# Patient Record
Sex: Male | Born: 2000 | Race: Black or African American | Hispanic: No | Marital: Single | Smoking: Current every day smoker
Health system: Southern US, Community
[De-identification: ages and names within clinical notes are randomized; demographics above are authoritative.]

---
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
## 2019-07-03 DIAGNOSIS — J069 Acute upper respiratory infection, unspecified: Secondary | ICD-10-CM | POA: Diagnosis not present

## 2019-07-03 DIAGNOSIS — R05 Cough: Secondary | ICD-10-CM | POA: Diagnosis not present

## 2019-07-03 DIAGNOSIS — R519 Headache, unspecified: Secondary | ICD-10-CM | POA: Diagnosis not present

## 2019-07-03 DIAGNOSIS — J029 Acute pharyngitis, unspecified: Secondary | ICD-10-CM | POA: Diagnosis not present

## 2019-09-03 DIAGNOSIS — J029 Acute pharyngitis, unspecified: Secondary | ICD-10-CM | POA: Diagnosis not present

## 2019-09-03 DIAGNOSIS — R110 Nausea: Secondary | ICD-10-CM | POA: Diagnosis not present

## 2019-09-03 DIAGNOSIS — B349 Viral infection, unspecified: Secondary | ICD-10-CM | POA: Diagnosis not present

## 2019-09-03 DIAGNOSIS — R519 Headache, unspecified: Secondary | ICD-10-CM | POA: Diagnosis not present

## 2019-09-03 DIAGNOSIS — R109 Unspecified abdominal pain: Secondary | ICD-10-CM | POA: Diagnosis not present

## 2019-09-03 DIAGNOSIS — Z20828 Contact with and (suspected) exposure to other viral communicable diseases: Secondary | ICD-10-CM | POA: Diagnosis not present

## 2020-01-22 DIAGNOSIS — G43909 Migraine, unspecified, not intractable, without status migrainosus: Secondary | ICD-10-CM | POA: Diagnosis not present

## 2020-05-14 DIAGNOSIS — Z20822 Contact with and (suspected) exposure to covid-19: Secondary | ICD-10-CM | POA: Diagnosis not present

## 2020-05-14 DIAGNOSIS — M791 Myalgia, unspecified site: Secondary | ICD-10-CM | POA: Diagnosis not present

## 2020-05-14 DIAGNOSIS — R519 Headache, unspecified: Secondary | ICD-10-CM | POA: Diagnosis not present

## 2020-05-14 DIAGNOSIS — J069 Acute upper respiratory infection, unspecified: Secondary | ICD-10-CM | POA: Diagnosis not present

## 2020-06-21 DIAGNOSIS — Z20822 Contact with and (suspected) exposure to covid-19: Secondary | ICD-10-CM | POA: Diagnosis not present

## 2020-08-09 DIAGNOSIS — J069 Acute upper respiratory infection, unspecified: Secondary | ICD-10-CM | POA: Diagnosis not present

## 2020-08-09 DIAGNOSIS — R0981 Nasal congestion: Secondary | ICD-10-CM | POA: Diagnosis not present

## 2020-08-09 DIAGNOSIS — R059 Cough, unspecified: Secondary | ICD-10-CM | POA: Diagnosis not present

## 2020-08-09 DIAGNOSIS — J3489 Other specified disorders of nose and nasal sinuses: Secondary | ICD-10-CM | POA: Diagnosis not present

## 2020-08-10 ENCOUNTER — Telehealth: Payer: Self-pay

## 2020-08-10 NOTE — Telephone Encounter (Signed)
Transition Care Management Unsuccessful Follow-up Telephone Call  Date of discharge and from where:  08/09/2020 [facility]  Attempts:  1st Attempt  Reason for unsuccessful TCM follow-up call:  Left voice message

## 2020-08-11 NOTE — Telephone Encounter (Signed)
Transition Care Management Unsuccessful Follow-up Telephone Call  Date of discharge and from where:  08/09/2020 [facility]  Attempts:  2nd Attempt  Reason for unsuccessful TCM follow-up call:  Left voice message

## 2020-08-13 NOTE — Telephone Encounter (Signed)
Transition Care Management Unsuccessful Follow-up Telephone Call  Date of discharge and from where: 11/15/20[facility]  Attempts:  3rd Attempt  Reason for unsuccessful TCM follow-up call:  Left voice message

## 2020-09-30 DIAGNOSIS — R220 Localized swelling, mass and lump, head: Secondary | ICD-10-CM | POA: Diagnosis not present

## 2020-09-30 DIAGNOSIS — Z77098 Contact with and (suspected) exposure to other hazardous, chiefly nonmedicinal, chemicals: Secondary | ICD-10-CM | POA: Diagnosis not present

## 2020-12-07 DIAGNOSIS — J029 Acute pharyngitis, unspecified: Secondary | ICD-10-CM | POA: Diagnosis not present

## 2020-12-07 DIAGNOSIS — Z20822 Contact with and (suspected) exposure to covid-19: Secondary | ICD-10-CM | POA: Diagnosis not present

## 2021-06-01 ENCOUNTER — Ambulatory Visit (HOSPITAL_COMMUNITY)
Admission: EM | Admit: 2021-06-01 | Discharge: 2021-06-01 | Disposition: A | Discharge status: Home / Self Care | Payer: Medicaid Other | Attending: Emergency Medicine | Admitting: Emergency Medicine

## 2021-06-01 ENCOUNTER — Encounter (HOSPITAL_COMMUNITY): Payer: Self-pay

## 2021-06-01 DIAGNOSIS — Z202 Contact with and (suspected) exposure to infections with a predominantly sexual mode of transmission: Secondary | ICD-10-CM | POA: Diagnosis not present

## 2021-06-01 LAB — HIV ANTIBODY (ROUTINE TESTING W REFLEX): HIV Screen 4th Generation wRfx: NONREACTIVE

## 2021-06-01 MED ORDER — DOXYCYCLINE HYCLATE 100 MG PO CAPS: 100.0000 mg | ORAL_CAPSULE | Freq: Two times a day (BID) | ORAL | 0 refills | Status: AC

## 2021-06-01 NOTE — ED Triage Notes (Signed)
Pt presents for STD Testing with no known symptoms; pt states his old partner was tested & treated for chlamydia.

## 2021-06-01 NOTE — Discharge Instructions (Addendum)
Because you have a known exposure to chlamydia, CDC guidelines recommended that you were treated today for both gonorrhea and chlamydia.  You received an injection of ceftriaxone 500 mg.  You will need to go to your pharmacy today to pick up a prescription for an antibiotic called doxycycline 100 mg.  Please take 1 tablet twice daily for the next 7 days.  Please take all doses as prescribed.  It is recommended that you follow-up with your primary care provider for retesting to make sure that you no longer have either infection.  The results of your STD screening today will be provided to you once they are available.  These usually takes 3 to 5 days.

## 2021-06-01 NOTE — ED Provider Notes (Signed)
[facility]    CSN: [id number] Arrival date & time: 06/01/21  0935      History   Chief Complaint Chief Complaint  Patient presents with   SEXUALLY TRANSMITTED DISEASE    HPI Keith Byrd is a 20 y.o. male.   Patient today reports being contacted recently by a former that she tested positive for chlamydia.  Patient states that they had sex approximately a month ago.  Patient denies penile discharge, genital lesion, painful urination, pelvic pain, testicular pain, fever, nausea, vomiting, diarrhea, body aches.  The history is provided by the patient.   History reviewed. No pertinent past medical history.  Patient Active Problem List   Diagnosis Date Noted   Enlarged tonsils 07/06/2015   Sports physical 02/01/2015   Subareolar gynecomastia in male 10/27/2013   LEARNING DISABILITY 05/11/2010   ALLERGIC RHINITIS 05/11/2010   INGUINAL HERNIA 11/22/2006    History reviewed. No pertinent surgical history.     Home Medications    Prior to Admission medications   Medication Sig Start Date End Date Taking? Authorizing Provider  doxycycline (VIBRAMYCIN) 100 MG capsule Take 1 capsule (100 mg total) by mouth 2 (two) times daily for 7 days. 06/01/21 06/08/21 Yes Theadora Rama Scales, PA-C  ibuprofen (ADVIL,MOTRIN) 200 MG tablet Take 200 mg by mouth every 6 (six) hours as needed for headache.    [provider]  Pseudoeph-Doxylamine-DM-APAP (NYQUIL PO) Take 1 capsule by mouth at bedtime as needed (cold symptoms/ congestion).    [provider]  Pseudoephedrine-APAP-DM (DAYQUIL PO) Take 1 capsule by mouth daily as needed (cold symptoms/ congestion).    [provider]    Family History History reviewed. No pertinent family history.  Social History Social History   Tobacco Use   Smoking status: Passive Smoke Exposure - Never Smoker  Substance Use Topics   Alcohol use: No     Allergies   Patient has no known allergies.   Review of  Systems Per HPI   Physical Exam Triage Vital Signs ED Triage Vitals  Enc Vitals Group     BP 06/01/21 1055 115/68     Pulse Rate 06/01/21 1055 (!) 58     Resp 06/01/21 1055 18     Temp 06/01/21 1055 98.5 F (36.9 C)     Temp Source 06/01/21 1055 Oral     SpO2 06/01/21 1055 99 %     Weight --      Height --      Head Circumference --      Peak Flow --      Pain Score 06/01/21 1054 0     Pain Loc --      Pain Edu? --      Excl. in GC? --    No data found.  Updated Vital Signs BP 115/68 (BP Location: Left Arm)   Pulse (!) 58   Temp 98.5 F (36.9 C) (Oral)   Resp 18   SpO2 99%   Visual Acuity Right Eye Distance:   Left Eye Distance:   Bilateral Distance:    Right Eye Near:   Left Eye Near:    Bilateral Near:     Physical Exam Constitutional:      Appearance: Normal appearance.  HENT:     Head: Normocephalic and atraumatic.     Right Ear: Tympanic membrane normal.     Left Ear: Tympanic membrane normal.     Nose: Nose normal.     Mouth/Throat:  Mouth: Mucous membranes are moist.     Pharynx: Oropharynx is clear.  Eyes:     Conjunctiva/sclera: Conjunctivae normal.     Pupils: Pupils are equal, round, and reactive to light.  Cardiovascular:     Rate and Rhythm: Normal rate and regular rhythm.     Heart sounds: Normal heart sounds.  Pulmonary:     Effort: Pulmonary effort is normal.     Breath sounds: Normal breath sounds.  Abdominal:     General: Abdomen is flat. Bowel sounds are normal.     Palpations: Abdomen is soft.     Tenderness: There is no abdominal tenderness. There is no right CVA tenderness, left CVA tenderness or guarding.  Genitourinary:    Comments: Patient politely declined GU exam Musculoskeletal:        General: Normal range of motion.     Cervical back: Normal range of motion and neck supple.  Skin:    General: Skin is warm and dry.  Neurological:     General: No focal deficit present.     Mental Status: He is alert and  oriented to person, place, and time.  Psychiatric:        Mood and Affect: Mood normal.        Behavior: Behavior normal.     UC Treatments / Results  Labs (all labs ordered are listed, but only abnormal results are displayed) Labs Reviewed  RPR  HIV ANTIBODY (ROUTINE TESTING W REFLEX)  URINE CYTOLOGY ANCILLARY ONLY    EKG   Radiology No results found.  Procedures Procedures (including critical care time)  Medications Ordered in UC Medications - No data to display  Initial Impression / Assessment and Plan / UC Course  I have reviewed the triage vital signs and the nursing notes.  Pertinent labs & imaging results that were available during my care of the patient were reviewed by me and considered in my medical decision making (see chart for details).     Because patient reports known exposure to chlamydia, we will go ahead and treat patient for both gonorrhea and chlamydia today.  Urine was obtained for cytology, patient advised he will be notified of results once received.  Patient advised to abstain from sexual intercourse for the next 7 days while being treated with doxycycline.  Patient was given ceftriaxone 500 mg injection during visit today, patient tolerated well.  All questions were addressed during visit today. Final Clinical Impressions(s) / UC Diagnoses   Final diagnoses:  Exposure to sexually transmitted disease (STD)  Exposure to chlamydia     Discharge Instructions      Because you have a known exposure to chlamydia, CDC guidelines recommended that you were treated today for both gonorrhea and chlamydia.  You received an injection of ceftriaxone 500 mg.  You will need to go to your pharmacy today to pick up a prescription for an antibiotic called doxycycline 100 mg.  Please take 1 tablet twice daily for the next 7 days.  Please take all doses as prescribed.  It is recommended that you follow-up with your primary care provider for retesting to make sure  that you no longer have either infection.  The results of your STD screening today will be provided to you once they are available.  These usually takes 3 to 5 days.     ED Prescriptions     Medication Sig Dispense Auth. Provider   doxycycline (VIBRAMYCIN) 100 MG capsule Take 1 capsule (100 mg total) by  mouth 2 (two) times daily for 7 days. 14 capsule Theadora Rama Scales, PA-C      PDMP not reviewed this encounter.   Theadora Rama Scales, PA-C 06/01/21 1148

## 2021-06-02 LAB — RPR: RPR Ser Ql: NONREACTIVE

## 2021-06-02 LAB — URINE CYTOLOGY ANCILLARY ONLY
Chlamydia: NEGATIVE
Comment: NEGATIVE
Comment: NEGATIVE
Comment: NORMAL
Neisseria Gonorrhea: NEGATIVE
Trichomonas: NEGATIVE

## 2021-06-10 ENCOUNTER — Ambulatory Visit
Admission: EM | Admit: 2021-06-10 | Discharge: 2021-06-10 | Disposition: A | Discharge status: Home / Self Care | Payer: Medicaid Other | Attending: Urgent Care | Admitting: Urgent Care

## 2021-06-10 ENCOUNTER — Other Ambulatory Visit: Payer: Self-pay

## 2021-06-10 DIAGNOSIS — S161XXA Strain of muscle, fascia and tendon at neck level, initial encounter: Secondary | ICD-10-CM

## 2021-06-10 DIAGNOSIS — M545 Low back pain, unspecified: Secondary | ICD-10-CM

## 2021-06-10 DIAGNOSIS — S39012A Strain of muscle, fascia and tendon of lower back, initial encounter: Secondary | ICD-10-CM | POA: Diagnosis not present

## 2021-06-10 DIAGNOSIS — M542 Cervicalgia: Secondary | ICD-10-CM

## 2021-06-10 DIAGNOSIS — S50812A Abrasion of left forearm, initial encounter: Secondary | ICD-10-CM | POA: Diagnosis not present

## 2021-06-10 MED ORDER — TETANUS-DIPHTH-ACELL PERTUSSIS 5-2.5-18.5 LF-MCG/0.5 IM SUSY: 0.5000 mL | PREFILLED_SYRINGE | Freq: Once | INTRAMUSCULAR | Status: DC

## 2021-06-10 MED ORDER — BACITRACIN ZINC 500 UNIT/GM EX OINT: 1.0000 "application " | TOPICAL_OINTMENT | Freq: Two times a day (BID) | CUTANEOUS | 0 refills | Status: DC

## 2021-06-10 MED ORDER — NAPROXEN 500 MG PO TABS: 500.0000 mg | ORAL_TABLET | Freq: Two times a day (BID) | ORAL | 0 refills | Status: DC

## 2021-06-10 MED ORDER — TIZANIDINE HCL 4 MG PO TABS: 4.0000 mg | ORAL_TABLET | Freq: Every day | ORAL | 0 refills | Status: DC
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
## 2021-06-10 NOTE — Discharge Instructions (Addendum)
Please change your dressing pf the left forearm 2-3 times daily. Use non-stick gauze. Alternate between wet (applying ointment) and dry dressings (no ointments). Each time you change your dressing, make sure you clean gently around the perimeter of the wound and the wound itself with gentle soap and warm water. Pat your wound dry and let it air out if possible for 1-2 hours before reapplying another dressing. Once your wound scabs over completely, you do not need to keep applying dressings.   Use naproxen for pain and inflammation and tizanidine as a muscle relaxant.
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
## 2021-07-25 ENCOUNTER — Ambulatory Visit
Admission: EM | Admit: 2021-07-25 | Discharge: 2021-07-25 | Disposition: A | Discharge status: Home / Self Care | Payer: Medicaid Other | Attending: Internal Medicine | Admitting: Internal Medicine

## 2021-07-25 ENCOUNTER — Encounter: Payer: Self-pay | Admitting: Emergency Medicine

## 2021-07-25 ENCOUNTER — Ambulatory Visit (INDEPENDENT_AMBULATORY_CARE_PROVIDER_SITE_OTHER): Payer: Medicaid Other

## 2021-07-25 ENCOUNTER — Other Ambulatory Visit: Payer: Self-pay

## 2021-07-25 DIAGNOSIS — M7989 Other specified soft tissue disorders: Secondary | ICD-10-CM | POA: Diagnosis not present

## 2021-07-25 DIAGNOSIS — W19XXXA Unspecified fall, initial encounter: Secondary | ICD-10-CM

## 2021-07-25 DIAGNOSIS — S92252A Displaced fracture of navicular [scaphoid] of left foot, initial encounter for closed fracture: Secondary | ICD-10-CM | POA: Diagnosis not present

## 2021-07-25 DIAGNOSIS — M79672 Pain in left foot: Secondary | ICD-10-CM

## 2021-07-25 DIAGNOSIS — M25572 Pain in left ankle and joints of left foot: Secondary | ICD-10-CM | POA: Diagnosis not present

## 2021-07-25 MED ORDER — IBUPROFEN 600 MG PO TABS: 600.0000 mg | ORAL_TABLET | Freq: Four times a day (QID) | ORAL | 0 refills | Status: DC | PRN

## 2021-07-25 NOTE — ED Provider Notes (Signed)
[name]    CSN: [id number] Arrival date & time: 07/25/21  0801      History   Chief Complaint Chief Complaint  Patient presents with   Ankle Pain    HPI Keith Byrd is a 20 y.o. male.   Patient presents with left ankle and foot pain that started yesterday after an injury. Patient reports that he jumped up while playing basketball and twisted his ankle medially when he landed. Having pain generalized to ankle and on the dorsal surface of the foot. Is able to bear weight but with pain. Denies numbness or tingling. Has used ice application.    Ankle Pain  History reviewed. No pertinent past medical history.  Patient Active Problem List   Diagnosis Date Noted   Enlarged tonsils 07/06/2015   Sports physical 02/01/2015   Subareolar gynecomastia in male 10/27/2013   LEARNING DISABILITY 05/11/2010   ALLERGIC RHINITIS 05/11/2010   INGUINAL HERNIA 11/22/2006    History reviewed. No pertinent surgical history.     Home Medications    Prior to Admission medications   Medication Sig Start Date End Date Taking? Authorizing Provider  ibuprofen (ADVIL) 600 MG tablet Take 1 tablet (600 mg total) by mouth every 6 (six) hours as needed for mild pain or moderate pain. 07/25/21  Yes Woodson Macha, Rolly Salter E, FNP  bacitracin ointment Apply 1 application topically 2 (two) times daily. 06/10/21   Wallis Bamberg, PA-C  Pseudoeph-Doxylamine-DM-APAP (NYQUIL PO) Take 1 capsule by mouth at bedtime as needed (cold symptoms/ congestion).    [provider]  Pseudoephedrine-APAP-DM (DAYQUIL PO) Take 1 capsule by mouth daily as needed (cold symptoms/ congestion).    [provider]  tiZANidine (ZANAFLEX) 4 MG tablet Take 1 tablet (4 mg total) by mouth at bedtime. 06/10/21   Wallis Bamberg, PA-C    Family History History reviewed. No pertinent family history.  Social History Social History   Tobacco Use   Smoking status: Never    Passive exposure: Yes   Smokeless  tobacco: Never  Substance Use Topics   Alcohol use: No     Allergies   Patient has no known allergies.   Review of Systems Review of Systems Per HPI  Physical Exam Triage Vital Signs ED Triage Vitals [07/25/21 0813]  Enc Vitals Group     BP 114/71     Pulse Rate 61     Resp 16     Temp 97.6 F (36.4 C)     Temp Source Oral     SpO2 97 %     Weight      Height      Head Circumference      Peak Flow      Pain Score 8     Pain Loc      Pain Edu?      Excl. in GC?    No data found.  Updated Vital Signs BP 114/71 (BP Location: Left Arm)   Pulse 61   Temp 97.6 F (36.4 C) (Oral)   Resp 16   SpO2 97%   Visual Acuity Right Eye Distance:   Left Eye Distance:   Bilateral Distance:    Right Eye Near:   Left Eye Near:    Bilateral Near:     Physical Exam Constitutional:      General: He is not in acute distress.    Appearance: Normal appearance. He is not toxic-appearing or diaphoretic.  HENT:     Head: Normocephalic and atraumatic.  Eyes:     Extraocular Movements: Extraocular movements intact.     Conjunctiva/sclera: Conjunctivae normal.  Pulmonary:     Effort: Pulmonary effort is normal.  Musculoskeletal:     Right ankle: Normal.     Left ankle: Swelling present. No deformity. Tenderness present. No lateral malleolus tenderness. Normal range of motion. Anterior drawer test negative. Normal pulse.     Right foot: Normal.     Left foot: Normal range of motion and normal capillary refill. Swelling, tenderness and bony tenderness present. No deformity or crepitus. Normal pulse.     Comments: Tenderness to palpation generalized throughout left ankle. Tenderness to palpation with associated swelling to proximal dorsal surface of left foot that approximately 2-4 metatarsals. Neurovascular intact.   Neurological:     General: No focal deficit present.     Mental Status: He is alert and oriented to person, place, and time. Mental status is at baseline.   Psychiatric:        Mood and Affect: Mood normal.        Behavior: Behavior normal.        Thought Content: Thought content normal.        Judgment: Judgment normal.     UC Treatments / Results  Labs (all labs ordered are listed, but only abnormal results are displayed) Labs Reviewed - No data to display  EKG   Radiology DG Ankle Complete Left  Result Date: 07/25/2021 CLINICAL DATA:  Fall EXAM: LEFT ANKLE COMPLETE - 3+ VIEW COMPARISON:  None. FINDINGS: There is evidence of a dorsal navicular avulsion fracture and soft tissue swelling. There is no significant ankle arthritis. IMPRESSION: Minimally displaced dorsal navicular avulsion fracture. Electronically Signed   By: Caprice Renshaw M.D.   On: 07/25/2021 08:50   DG Foot Complete Left  Result Date: 07/25/2021 CLINICAL DATA:  Fall EXAM: LEFT FOOT - COMPLETE 3+ VIEW COMPARISON:  None. FINDINGS: There is dorsal navicular spurring with possible small dorsal avulsion and adjacent soft tissue swelling. Otherwise normal alignment. IMPRESSION: Minimally displaced dorsal navicular avulsion fracture. Electronically Signed   By: Caprice Renshaw M.D.   On: 07/25/2021 08:50    Procedures Procedures (including critical care time)  Medications Ordered in UC Medications - No data to display  Initial Impression / Assessment and Plan / UC Course  I have reviewed the triage vital signs and the nursing notes.  Pertinent labs & imaging results that were available during my care of the patient were reviewed by me and considered in my medical decision making (see chart for details).     Patient has minimally displaced navicular avulsion fracture of left extremity.  Patient advised that lower extremity splinting supplies are not available in urgent care and was advised that it would be best for him to go to a different urgent care for splinting but patient declined.  Risks associated with not having proper splinting were discussed with patient.  A cam  boot with crutches were supplied in urgent care today.  Patient was advised to not bear weight until seen by orthopedist.  Patient was advised that the next steps will be to be evaluated by orthopedist.  Patient was provided with contact information to call in the next 1 to 2 days for further follow-up.  No red flags seen on exam and patient is neurovascularly intact.  RICE.  Ibuprofen prescribed and patient was advised to not take any additional NSAIDs.  Discussed strict return precautions.  Patient verbalized understanding and was agreeable plan.  Final Clinical Impressions(s) / UC Diagnoses   Final diagnoses:  Closed avulsion fracture of navicular bone of left foot, initial encounter  Acute left ankle pain  Left foot pain     Discharge Instructions      You have a fracture of your foot.  A boot and crutches have been supplied for you in urgent care today.  Please do not bear any weight on your foot until seen by orthopedist and otherwise advised.  Please call provided contact information for orthopedist for further evaluation and management.  Also use ice application and take ibuprofen as needed.     ED Prescriptions     Medication Sig Dispense Auth. Provider   ibuprofen (ADVIL) 600 MG tablet Take 1 tablet (600 mg total) by mouth every 6 (six) hours as needed for mild pain or moderate pain. 30 tablet [place], [place], [place]      I have reviewed the PDMP during this encounter.   Gustavus Bryant, [place] 07/25/21 [phone]

## 2021-07-25 NOTE — ED Triage Notes (Signed)
Fell from jumping while playing basketball yesterday, rolled left ankle laterally. Able to walk with pain

## 2021-07-25 NOTE — Discharge Instructions (Signed)
You have a fracture of your foot.  A boot and crutches have been supplied for you in urgent care today.  Please do not bear any weight on your foot until seen by orthopedist and otherwise advised.  Please call provided contact information for orthopedist for further evaluation and management.  Also use ice application and take ibuprofen as needed.

## 2021-08-16 ENCOUNTER — Other Ambulatory Visit: Payer: Self-pay

## 2021-08-16 ENCOUNTER — Ambulatory Visit
Admission: EM | Admit: 2021-08-16 | Discharge: 2021-08-16 | Disposition: A | Discharge status: Home / Self Care | Payer: Medicaid Other | Attending: Physician Assistant | Admitting: Physician Assistant

## 2021-08-16 DIAGNOSIS — J101 Influenza due to other identified influenza virus with other respiratory manifestations: Secondary | ICD-10-CM

## 2021-08-16 MED ORDER — OSELTAMIVIR PHOSPHATE 75 MG PO CAPS: 75.0000 mg | ORAL_CAPSULE | Freq: Two times a day (BID) | ORAL | 0 refills | Status: DC

## 2021-08-16 NOTE — ED Triage Notes (Signed)
Onset yesterday of HA, chills and runny nose. Has been taking Nyquil w/o relief. Emesis x1.  Kids are sick.

## 2021-08-16 NOTE — ED Provider Notes (Signed)
[name]    CSN: [id number] Arrival date & time: 08/16/21  1140      History   Chief Complaint Chief Complaint  Patient presents with   Headache   Nasal Congestion    HPI Keith Byrd is a 20 y.o. male.   Patient here today for evaluation of headache, runny nose, chills and 1 episode of vomiting that started yesterday.  He has tried over-the-counter medication with mild relief.  He denies any sore throat.  His son and daughter are both sick with similar symptoms.  The history is provided by the patient.  Headache Associated symptoms: congestion, cough, nausea and vomiting   Associated symptoms: no abdominal pain, no ear pain, no fever and no sore throat    History reviewed. No pertinent past medical history.  Patient Active Problem List   Diagnosis Date Noted   Enlarged tonsils 07/06/2015   Sports physical 02/01/2015   Subareolar gynecomastia in male 10/27/2013   LEARNING DISABILITY 05/11/2010   ALLERGIC RHINITIS 05/11/2010   INGUINAL HERNIA 11/22/2006    History reviewed. No pertinent surgical history.     Home Medications    Prior to Admission medications   Medication Sig Start Date End Date Taking? Authorizing Provider  oseltamivir (TAMIFLU) 75 MG capsule Take 1 capsule (75 mg total) by mouth every 12 (twelve) hours. 08/16/21  Yes Tomi Bamberger, PA-C  bacitracin ointment Apply 1 application topically 2 (two) times daily. 06/10/21   Wallis Bamberg, PA-C  ibuprofen (ADVIL) 600 MG tablet Take 1 tablet (600 mg total) by mouth every 6 (six) hours as needed for mild pain or moderate pain. 07/25/21   Gustavus Bryant, FNP  Pseudoeph-Doxylamine-DM-APAP (NYQUIL PO) Take 1 capsule by mouth at bedtime as needed (cold symptoms/ congestion).    [provider]  Pseudoephedrine-APAP-DM (DAYQUIL PO) Take 1 capsule by mouth daily as needed (cold symptoms/ congestion).    [provider]  tiZANidine (ZANAFLEX) 4 MG tablet Take 1 tablet (4 mg  total) by mouth at bedtime. 06/10/21   Wallis Bamberg, PA-C    Family History History reviewed. No pertinent family history.  Social History Social History   Tobacco Use   Smoking status: Never    Passive exposure: Yes   Smokeless tobacco: Never  Substance Use Topics   Alcohol use: No     Allergies   Patient has no known allergies.   Review of Systems Review of Systems  Constitutional:  Positive for chills. Negative for fever.  HENT:  Positive for congestion. Negative for ear pain and sore throat.   Eyes:  Negative for discharge and redness.  Respiratory:  Positive for cough. Negative for shortness of breath.   Gastrointestinal:  Positive for nausea and vomiting. Negative for abdominal pain.  Neurological:  Positive for headaches.    Physical Exam Triage Vital Signs ED Triage Vitals  Enc Vitals Group     BP 08/16/21 1216 110/67     Pulse Rate 08/16/21 1216 81     Resp 08/16/21 1216 18     Temp 08/16/21 1216 98.2 F (36.8 C)     Temp Source 08/16/21 1216 Oral     SpO2 08/16/21 1216 97 %     Weight --      Height --      Head Circumference --      Peak Flow --      Pain Score 08/16/21 1219 7     Pain Loc --  Pain Edu? --      Excl. in GC? --    No data found.  Updated Vital Signs BP 110/67 (BP Location: Right Arm)   Pulse 81   Temp 98.2 F (36.8 C) (Oral)   Resp 18   SpO2 97%       Physical Exam Vitals and nursing note reviewed.  Constitutional:      General: He is not in acute distress.    Appearance: Normal appearance. He is not ill-appearing.  HENT:     Head: Normocephalic and atraumatic.     Nose: Congestion (mild) present.     Mouth/Throat:     Mouth: Mucous membranes are moist.     Pharynx: Oropharynx is clear. No oropharyngeal exudate or posterior oropharyngeal erythema.  Eyes:     Conjunctiva/sclera: Conjunctivae normal.  Cardiovascular:     Rate and Rhythm: Normal rate and regular rhythm.     Heart sounds: Normal heart sounds. No  murmur heard. Pulmonary:     Effort: Pulmonary effort is normal. No respiratory distress.     Breath sounds: Normal breath sounds. No wheezing, rhonchi or rales.  Skin:    General: Skin is warm and dry.  Neurological:     Mental Status: He is alert.  Psychiatric:        Mood and Affect: Mood normal.        Thought Content: Thought content normal.     UC Treatments / Results  Labs (all labs ordered are listed, but only abnormal results are displayed) Labs Reviewed  POCT INFLUENZA A/B    EKG   Radiology No results found.  Procedures Procedures (including critical care time)  Medications Ordered in UC Medications - No data to display  Initial Impression / Assessment and Plan / UC Course  I have reviewed the triage vital signs and the nursing notes.  Pertinent labs & imaging results that were available during my care of the patient were reviewed by me and considered in my medical decision making (see chart for details).    Flu test positive in office.  Will treat with Tamiflu.  Encouraged symptomatic treatment otherwise along with increase fluids and rest.  Recommend follow-up if symptoms fail to improve or worsen.  Final Clinical Impressions(s) / UC Diagnoses   Final diagnoses:  Influenza A   Discharge Instructions   None    ED Prescriptions     Medication Sig Dispense Auth. Provider   oseltamivir (TAMIFLU) 75 MG capsule Take 1 capsule (75 mg total) by mouth every 12 (twelve) hours. 10 capsule Tomi Bamberger, PA-C      PDMP not reviewed this encounter.   Tomi Bamberger, PA-C 08/16/21 1245

## 2022-09-02 ENCOUNTER — Emergency Department (HOSPITAL_COMMUNITY)
Admission: EM | Admit: 2022-09-02 | Discharge: 2022-09-02 | Disposition: A | Discharge status: Home / Self Care | Payer: Self-pay | Attending: Emergency Medicine | Admitting: Emergency Medicine

## 2022-09-02 ENCOUNTER — Emergency Department (HOSPITAL_COMMUNITY): Payer: Self-pay

## 2022-09-02 ENCOUNTER — Other Ambulatory Visit: Payer: Self-pay

## 2022-09-02 DIAGNOSIS — M79645 Pain in left finger(s): Secondary | ICD-10-CM

## 2022-09-02 DIAGNOSIS — M25562 Pain in left knee: Secondary | ICD-10-CM | POA: Insufficient documentation

## 2022-09-02 DIAGNOSIS — L03012 Cellulitis of left finger: Secondary | ICD-10-CM | POA: Insufficient documentation

## 2022-09-02 MED ORDER — BACITRACIN ZINC 500 UNIT/GM EX OINT: 1.0000 | TOPICAL_OINTMENT | Freq: Two times a day (BID) | CUTANEOUS | 0 refills | Status: DC

## 2022-09-02 MED ORDER — CEPHALEXIN 500 MG PO CAPS: 500.0000 mg | ORAL_CAPSULE | Freq: Four times a day (QID) | ORAL | 0 refills | Status: DC

## 2022-09-02 MED ORDER — BACITRACIN ZINC 500 UNIT/GM EX OINT
TOPICAL_OINTMENT | Freq: Once | CUTANEOUS | Status: AC
  Administered 2022-09-02: 1 via TOPICAL
  Filled 2022-09-02: qty 0.9

## 2022-09-02 NOTE — ED Triage Notes (Signed)
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

## 2022-09-02 NOTE — Discharge Instructions (Addendum)
It was a pleasure taking care of you today!  Your xray didn't show any concerning findings today. You will be sent with a prescription for bacitracin ointment and keflex, take as directed, to take to treat a likely beginning paronychia (cuticle infection). Also start with warm soaks for up to 15 minutes at a time. You may follow up with your primary care provider as needed. Return to the ED if you are experiencing increasing/worsening symptoms.

## 2022-09-02 NOTE — ED Notes (Signed)
Ice applied to left hand and left knee. Pt educated on ice application.

## 2022-09-02 NOTE — ED Provider Notes (Addendum)
[facility] EMERGENCY DEPARTMENT Provider Note   CSN: [id number] Arrival date & time: 09/02/22  6010     History  Chief Complaint  Patient presents with   Finger/Knee Pain     Keith Byrd is a 21 y.o. male who presents emergency department with concerns for left ring finger injury onset yesterday.  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  He notes that he noticed the pain/swelling to his left ring finger while driving his car.  Also notes that he has had left knee pain as well.  Denies swelling, color change, drainage, fever, neck pain, back pain, hip pain.  The history is provided by the patient. No language interpreter was used.       Home Medications Prior to Admission medications   Medication Sig Start Date End Date Taking? Authorizing Provider  cephALEXin (KEFLEX) 500 MG capsule Take 1 capsule (500 mg total) by mouth 4 (four) times daily. 09/02/22  Yes Malin Sambrano A, PA-C  bacitracin ointment Apply 1 Application topically 2 (two) times daily. 09/02/22   Ariely Riddell A, PA-C  ibuprofen (ADVIL) 600 MG tablet Take 1 tablet (600 mg total) by mouth every 6 (six) hours as needed for mild pain or moderate pain. 07/25/21   Gustavus Bryant, FNP  oseltamivir (TAMIFLU) 75 MG capsule Take 1 capsule (75 mg total) by mouth every 12 (twelve) hours. 08/16/21   Tomi Bamberger, PA-C  Pseudoeph-Doxylamine-DM-APAP (NYQUIL PO) Take 1 capsule by mouth at bedtime as needed (cold symptoms/ congestion).    [provider]  Pseudoephedrine-APAP-DM (DAYQUIL PO) Take 1 capsule by mouth daily as needed (cold symptoms/ congestion).    [provider]  tiZANidine (ZANAFLEX) 4 MG tablet Take 1 tablet (4 mg total) by mouth at bedtime. 06/10/21   Wallis Bamberg, PA-C      Allergies    Patient has no known allergies.    Review of Systems   Review of Systems  All other systems reviewed and are negative.   Physical  Exam Updated Vital Signs BP 113/62 (BP Location: Right Arm)   Pulse 94   Temp 98.3 F (36.8 C) (Oral)   Resp 18   Ht 5\' 9"  (1.753 m)   Wt 75.3 kg   SpO2 98%   BMI 24.51 kg/m  Physical Exam Vitals and nursing note reviewed.  Constitutional:      General: He is not in acute distress.    Appearance: Normal appearance.  Eyes:     General: No scleral icterus.    Extraocular Movements: Extraocular movements intact.  Cardiovascular:     Rate and Rhythm: Normal rate.  Pulmonary:     Effort: Pulmonary effort is normal. No respiratory distress.  Abdominal:     Palpations: Abdomen is soft. There is no mass.     Tenderness: There is no abdominal tenderness.  Musculoskeletal:        General: Normal range of motion.     Cervical back: Neck supple.     Comments: Tenderness to palpation noted to left fourth distal phalanx with swelling noted to the distal phalanx.  No obvious paronychia with abscess noted to finger.  Likely start of paronychia noted to left fourth finger. No fluctuance noted to palmar aspect of left fourth distal phalanx.  No overlying erythema.  Able to flex and extend digit against resistance without difficulty.  Radial pulse intact.  Grip strength intact.  Strength sensation intact to bilateral lower extremities.  Normal flexion and extension of left knee against resistance.  No tenderness to palpation to left patella.  Mild tenderness to palpation noted to lateral aspect of left distal femur without overlying skin changes.  Able to ambulate without assistance or difficulty.  Skin:    General: Skin is warm and dry.     Findings: No rash.  Neurological:     Mental Status: He is alert.     Sensory: Sensation is intact.     Motor: Motor function is intact.  Psychiatric:        Behavior: Behavior normal.     ED Results / Procedures / Treatments   Labs (all labs ordered are listed, but only abnormal results are displayed) Labs Reviewed - No data to  display  EKG None  Radiology DG Finger Ring Left  Result Date: 09/02/2022 CLINICAL DATA:  Left fourth finger pain. EXAM: LEFT RING FINGER 2+V COMPARISON:  None Available. FINDINGS: No fracture.  No bone lesion. Joints are normally spaced and aligned. Mild soft tissue swelling of the distal fourth finger. No soft tissue air or radiopaque foreign body. IMPRESSION: 1. Distal left fourth finger soft tissue swelling. No fracture. No other abnormality. Electronically Signed   By: Amie Portland M.D.   On: 09/02/2022 08:06   DG Knee Complete 4 Views Left  Result Date: 09/02/2022 CLINICAL DATA:  Knee pain when walking. EXAM: LEFT KNEE - COMPLETE 4+ VIEW COMPARISON:  None Available. FINDINGS: No evidence of fracture, dislocation, or joint effusion. No evidence of arthropathy or other focal bone abnormality. Soft tissues are unremarkable. IMPRESSION: Negative. Electronically Signed   By: Kennith Center M.D.   On: 09/02/2022 07:36   DG Finger Little Left  Result Date: 09/02/2022 CLINICAL DATA:  Finger pain and swelling. EXAM: LEFT RING FINGER 2+V COMPARISON:  None Available. FINDINGS: There is no evidence of fracture or dislocation. There is no evidence of arthropathy or other focal bone abnormality. Soft tissues are unremarkable. IMPRESSION: Negative. Electronically Signed   By: Kennith Center M.D.   On: 09/02/2022 07:35    Procedures Procedures    Medications Ordered in ED Medications  bacitracin ointment (has no administration in time range)    ED Course/ Medical Decision Making/ A&P Clinical Course as of 09/02/22 0825  Sat Sep 02, 2022  0741 Discussion with x-ray, x-ray that was ordered by myself was left ring finger, it appears x-ray technician obtain an x-ray of the left little finger.  X-ray to redo x-ray. [SB]  0820 Discussed with patient x-ray findings.  Discussed with patient discharge treatment plan.  Answered all available questions.  Patient appears safe for discharge at this time. [SB]     Clinical Course User Index [SB] Deaisa Merida A, PA-C                           Medical Decision Making Amount and/or Complexity of Data Reviewed Radiology: ordered.  Risk OTC drugs. Prescription drug management.   Pt presents with left ring finger and left knee injury onset yesterday.  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 Vital signs, patient afebrile. On exam, pt with Tenderness to palpation noted to left fourth distal phalanx with swelling noted to the distal phalanx.  No obvious paronychia with abscess noted to finger.  Likely start of paronychia noted to left fourth finger. No fluctuance noted to palmar aspect of left fourth  distal phalanx.  No overlying erythema.  Able to flex and extend digit against resistance without difficulty.  Radial pulse intact.  Grip strength intact.  Strength sensation intact to bilateral lower extremities.  Normal flexion and extension of left knee against resistance.  No tenderness to palpation to left patella.  Mild tenderness to palpation noted to lateral aspect of left distal femur without overlying skin changes.  Able to ambulate without assistance or difficulty. Differential diagnosis includes fracture, dislocation, cellulitis, paronychia, felon.     Imaging: I ordered imaging studies including left ring finger x-ray, left knee x-ray I independently visualized and interpreted imaging which showed: no acute fracture or dislocation.  1. Distal left fourth finger soft tissue swelling. No fracture. No  other abnormality.   I agree with the radiologist interpretation  Disposition: Presenting suspicious for likely beginning stages of paronychia without abscess at this time.  Doubt felon, cellulitis, fracture, dislocation. After consideration of the diagnostic results and the patients response to treatment, I feel that the patient would benefit from Discharge home.  Bacitracin ointment applied to finger prior to discharge.   Patient sent with prescription for bacitracin and Keflex.  Also advised on warm soaks.  Supportive care measures and strict return precautions discussed with patient at bedside. Pt acknowledges and verbalizes understanding. Pt appears safe for discharge. Follow up as indicated in discharge paperwork.    This chart was dictated using voice recognition software, Dragon. Despite the best efforts of this provider to proofread and correct errors, errors may still occur which can change documentation meaning.   Final Clinical Impression(s) / ED Diagnoses Final diagnoses:  Finger pain, left  Acute pain of left knee  Paronychia of finger, left    Rx / DC Orders ED Discharge Orders          Ordered    cephALEXin (KEFLEX) 500 MG capsule  4 times daily        09/02/22 0821    bacitracin ointment  2 times daily        09/02/22 0824              Kassadi Presswood A, PA-C 09/02/22 0826    Jackalyn Haith A, PA-C 09/02/22 0827    Pricilla Loveless, MD 09/02/22 1023

## 2022-11-02 ENCOUNTER — Ambulatory Visit (HOSPITAL_COMMUNITY)
Admission: EM | Admit: 2022-11-02 | Discharge: 2022-11-02 | Disposition: A | Discharge status: Home / Self Care | Payer: Self-pay | Attending: Family Medicine | Admitting: Family Medicine

## 2022-11-02 ENCOUNTER — Encounter (HOSPITAL_COMMUNITY): Payer: Self-pay

## 2022-11-02 DIAGNOSIS — N50811 Right testicular pain: Secondary | ICD-10-CM

## 2022-11-02 DIAGNOSIS — N5089 Other specified disorders of the male genital organs: Secondary | ICD-10-CM

## 2022-11-02 MED ORDER — IBUPROFEN 800 MG PO TABS: 800.0000 mg | ORAL_TABLET | Freq: Three times a day (TID) | ORAL | 0 refills | Status: DC | PRN

## 2022-11-02 MED ORDER — KETOROLAC TROMETHAMINE 30 MG/ML IJ SOLN
INTRAMUSCULAR | Status: AC
  Filled 2022-11-02: qty 1

## 2022-11-02 MED ORDER — KETOROLAC TROMETHAMINE 30 MG/ML IJ SOLN
30.0000 mg | Freq: Once | INTRAMUSCULAR | Status: AC
  Administered 2022-11-02: 30 mg via INTRAMUSCULAR

## 2022-11-02 NOTE — ED Triage Notes (Signed)
Pt states a 3-day history of scrotum pain. Pt reports heavy lifting and pulling at work.

## 2022-11-02 NOTE — ED Provider Notes (Signed)
[place]    CSN: [id number] Arrival date & time: 11/02/22  1525      History   Chief Complaint Chief Complaint  Patient presents with   Testicle Pain    HPI Keith Byrd is a 22 y.o. male.    Testicle Pain   Here for right testicle swelling and discomfort.  Is been going on for about 3 days.  No fever and no penile discharge or itching.  No abdominal pain or vomiting.  He reports he does do heavy lifting at work.  History reviewed. No pertinent past medical history.  Patient Active Problem List   Diagnosis Date Noted   Enlarged tonsils 07/06/2015   Sports physical 02/01/2015   Subareolar gynecomastia in male 10/27/2013   LEARNING DISABILITY 05/11/2010   ALLERGIC RHINITIS 05/11/2010   INGUINAL HERNIA 11/22/2006    History reviewed. No pertinent surgical history.     Home Medications    Prior to Admission medications   Medication Sig Start Date End Date Taking? Authorizing Provider  ibuprofen (ADVIL) 800 MG tablet Take 1 tablet (800 mg total) by mouth every 8 (eight) hours as needed (pain). 11/02/22  Yes Barrett Henle, MD    Family History History reviewed. No pertinent family history.  Social History Social History   Tobacco Use   Smoking status: Never    Passive exposure: Yes   Smokeless tobacco: Never  Substance Use Topics   Alcohol use: No     Allergies   Patient has no known allergies.   Review of Systems Review of Systems  Genitourinary:  Positive for testicular pain.     Physical Exam Triage Vital Signs ED Triage Vitals [11/02/22 1730]  Enc Vitals Group     BP 123/66     Pulse Rate (!) 54     Resp 18     Temp 97.8 F (36.6 C)     Temp Source Oral     SpO2 100 %     Weight      Height      Head Circumference      Peak Flow      Pain Score 10     Pain Loc      Pain Edu?      Excl. in [place]?    No data found.  Updated Vital Signs BP 123/66 (BP Location: Left Arm)   Pulse (!) 54   Temp 97.8 F (36.6  C) (Oral)   Resp 18   SpO2 100%   Visual Acuity Right Eye Distance:   Left Eye Distance:   Bilateral Distance:    Right Eye Near:   Left Eye Near:    Bilateral Near:     Physical Exam Vitals reviewed.  Constitutional:      General: He is not in acute distress.    Appearance: He is not toxic-appearing.  Genitourinary:    Comments: His right testicle is swollen and mildly tender.  It is not tense.  It is not erythematous and the skin is not indurated. Skin:    Coloration: Skin is not pale.  Neurological:     Mental Status: He is alert and oriented to person, place, and time.  Psychiatric:        Behavior: Behavior normal.      UC Treatments / Results  Labs (all labs ordered are listed, but only abnormal results are displayed) Labs Reviewed - No data to display  EKG   Radiology No results found.  Procedures Procedures (including critical care time)  Medications Ordered in UC Medications  ketorolac (TORADOL) 30 MG/ML injection 30 mg (has no administration in time range)    Initial Impression / Assessment and Plan / UC Course  I have reviewed the triage vital signs and the nursing notes.  Pertinent labs & imaging results that were available during my care of the patient were reviewed by me and considered in my medical decision making (see chart for details).        Ultrasound with Doppler is ordered.  If he has signs of epididymitis on the ultrasound, we will treat with Rocephin and doxycycline  He is given a shot of Toradol here Final Clinical Impressions(s) / UC Diagnoses   Final diagnoses:  Scrotal swelling  Pain in right testicle     Discharge Instructions      You have been given a shot of Toradol 30 mg today.  Take ibuprofen 800 mg--1 tab every 8 hours as needed for pain.       ED Prescriptions     Medication Sig Dispense Auth. Provider   ibuprofen (ADVIL) 800 MG tablet Take 1 tablet (800 mg total) by mouth every 8 (eight) hours  as needed (pain). 21 tablet Elishia Kaczorowski, Gwenlyn Perking, MD      PDMP not reviewed this encounter.   Barrett Henle, MD 11/02/22 [phone]

## 2022-11-02 NOTE — Discharge Instructions (Signed)
You have been given a shot of Toradol 30 mg today.   Take ibuprofen 800 mg--1 tab every 8 hours as needed for pain.    

## 2023-01-23 ENCOUNTER — Encounter (HOSPITAL_COMMUNITY): Payer: Self-pay

## 2023-01-23 ENCOUNTER — Ambulatory Visit (HOSPITAL_COMMUNITY)
Admission: EM | Admit: 2023-01-23 | Discharge: 2023-01-23 | Disposition: A | Discharge status: Home / Self Care | Payer: Self-pay | Attending: Emergency Medicine | Admitting: Emergency Medicine

## 2023-01-23 DIAGNOSIS — B349 Viral infection, unspecified: Secondary | ICD-10-CM | POA: Insufficient documentation

## 2023-01-23 LAB — POCT RAPID STREP A (OFFICE): Rapid Strep A Screen: NEGATIVE

## 2023-01-23 MED ORDER — LIDOCAINE VISCOUS HCL 2 % MT SOLN: 15.0000 mL | OROMUCOSAL | 0 refills | Status: AC | PRN

## 2023-01-23 MED ORDER — IBUPROFEN 800 MG PO TABS: 800.0000 mg | ORAL_TABLET | Freq: Three times a day (TID) | ORAL | 0 refills | Status: AC | PRN

## 2023-01-23 MED ORDER — IBUPROFEN 800 MG PO TABS
ORAL_TABLET | ORAL | Status: AC
  Filled 2023-01-23: qty 1

## 2023-01-23 MED ORDER — IBUPROFEN 800 MG PO TABS
800.0000 mg | ORAL_TABLET | Freq: Once | ORAL | Status: AC
  Administered 2023-01-23: 800 mg via ORAL

## 2023-01-23 NOTE — ED Provider Notes (Signed)
[facility]    CSN: [id number] Arrival date & time: 01/23/23  1324      History   Chief Complaint Chief Complaint  Patient presents with   Chills   Headache   Abdominal Pain   Sore Throat    HPI Keith Byrd is a 22 y.o. male.  5 day history of sore throat, headache, chills No temp taken at home Denies cough, congestion, abd pain, NVD, rash.  Decreased appetite but tolerating fluids Tried tylenol once that helped headache Possible sick contacts at work  History reviewed. No pertinent past medical history.  Patient Active Problem List   Diagnosis Date Noted   Enlarged tonsils 07/06/2015   Sports physical 02/01/2015   Subareolar gynecomastia in male 10/27/2013   LEARNING DISABILITY 05/11/2010   ALLERGIC RHINITIS 05/11/2010   INGUINAL HERNIA 11/22/2006    History reviewed. No pertinent surgical history.     Home Medications    Prior to Admission medications   Medication Sig Start Date End Date Taking? Authorizing Provider  lidocaine (XYLOCAINE) 2 % solution Use as directed 15 mLs in the mouth or throat every 3 (three) hours as needed for mouth pain. Swish/gargle and spit out 01/23/23  Yes Brit Carbonell, Lurena Joiner, PA-C  ibuprofen (ADVIL) 800 MG tablet Take 1 tablet (800 mg total) by mouth every 8 (eight) hours as needed (pain). 01/23/23   Nyimah Shadduck, Ray Church    Family History History reviewed. No pertinent family history.  Social History Social History   Tobacco Use   Smoking status: Every Day    Packs/day: .15    Types: Cigarettes    Passive exposure: Yes   Smokeless tobacco: Never  Vaping Use   Vaping Use: Never used  Substance Use Topics   Alcohol use: No   Drug use: Never     Allergies   Patient has no known allergies.   Review of Systems Review of Systems As per HPI  Physical Exam Triage Vital Signs ED Triage Vitals  Enc Vitals Group     BP 01/23/23 0932 127/61     Pulse Rate 01/23/23 0932 77     Resp 01/23/23 0932 16      Temp 01/23/23 0932 100.1 F (37.8 C)     Temp Source 01/23/23 0932 Oral     SpO2 01/23/23 0932 97 %     Weight --      Height --      Head Circumference --      Peak Flow --      Pain Score 01/23/23 0933 10     Pain Loc --      Pain Edu? --      Excl. in [place]? --    No data found.  Updated Vital Signs BP 127/61 (BP Location: Left Arm)   Pulse 77   Temp 100.1 F (37.8 C) (Oral)   Resp 16   SpO2 97%     Physical Exam Vitals and nursing note reviewed.  Constitutional:      General: He is not in acute distress.    Appearance: He is not ill-appearing.  HENT:     Right Ear: Tympanic membrane and ear canal normal.     Left Ear: Tympanic membrane and ear canal normal.     Nose: No rhinorrhea.     Mouth/Throat:     Mouth: Mucous membranes are moist.     Pharynx: Oropharynx is clear. Posterior oropharyngeal erythema present. No pharyngeal swelling.     Tonsils:  Tonsillar exudate present. No tonsillar abscesses. 2+ on the right. 2+ on the left.     Comments: Normal phonation, tolerating secretions Eyes:     Conjunctiva/sclera: Conjunctivae normal.  Cardiovascular:     Rate and Rhythm: Normal rate and regular rhythm.     Heart sounds: Normal heart sounds.  Pulmonary:     Effort: Pulmonary effort is normal.     Breath sounds: Normal breath sounds.  Abdominal:     Palpations: Abdomen is soft.     Tenderness: There is no abdominal tenderness.  Musculoskeletal:     Cervical back: Normal range of motion. No rigidity.  Lymphadenopathy:     Cervical: Cervical adenopathy present.  Neurological:     Mental Status: He is alert and oriented to person, place, and time.      UC Treatments / Results  Labs (all labs ordered are listed, but only abnormal results are displayed) Labs Reviewed  CULTURE, GROUP A STREP [facility])  POCT RAPID STREP A (OFFICE)    EKG   Radiology No results found.  Procedures Procedures (including critical care time)  Medications Ordered in  UC Medications  ibuprofen (ADVIL) tablet 800 mg (800 mg Oral Given 01/23/23 1023)    Initial Impression / Assessment and Plan / UC Course  I have reviewed the triage vital signs and the nursing notes.  Pertinent labs & imaging results that were available during my care of the patient were reviewed by me and considered in my medical decision making (see chart for details).  Low grade 100.1 temp in clinic. Ibuprofen dose given Rapid strep negative x2, culture is pending. Discussed viral etiology, continuing symptomatic care at home. Lidocaine sent for gargle and spit. Work note provided Return precautions discussed. Patient agrees to plan  Final Clinical Impressions(s) / UC Diagnoses   Final diagnoses:  Viral illness     Discharge Instructions      Your strep test was negative. We will call you if anything grows on the throat culture and requires antibiotics (2 to 3 days)  I recommend using ibuprofen and Tylenol every 4-6 hours for pain and fever control.  You can also use the lidocaine, gargle and spit, as often as needed to numb the throat.  Make sure you are drinking lots of fluids!  Please return if you have any concerns or if symptoms worsen     ED Prescriptions     Medication Sig Dispense Auth. Provider   ibuprofen (ADVIL) 800 MG tablet Take 1 tablet (800 mg total) by mouth every 8 (eight) hours as needed (pain). 21 tablet Ollie Esty, PA-C   lidocaine (XYLOCAINE) 2 % solution Use as directed 15 mLs in the mouth or throat every 3 (three) hours as needed for mouth pain. Swish/gargle and spit out 100 mL Aidden Markovic, Lurena Joiner, PA-C      PDMP not reviewed this encounter.   Marlow Baars, [place] 01/23/23 1047

## 2023-01-23 NOTE — Discharge Instructions (Addendum)
Your strep test was negative. We will call you if anything grows on the throat culture and requires antibiotics (2 to 3 days)  I recommend using ibuprofen and Tylenol every 4-6 hours for pain and fever control.  You can also use the lidocaine, gargle and spit, as often as needed to numb the throat.  Make sure you are drinking lots of fluids!  Please return if you have any concerns or if symptoms worsen

## 2023-01-23 NOTE — ED Triage Notes (Signed)
Patient c/o chills, headache, abdominal pain, and a sore throat x 5 days. Patient states he was taking [place] and when he stopped he developed his symptoms.  Patient states he has taken Advil, Tylenol, and Nyquil. No medications today.

## 2023-01-25 LAB — CULTURE, GROUP A STREP (THRC)

## 2023-01-26 ENCOUNTER — Telehealth: Payer: Self-pay | Admitting: Emergency Medicine

## 2023-01-26 ENCOUNTER — Ambulatory Visit (INDEPENDENT_AMBULATORY_CARE_PROVIDER_SITE_OTHER): Payer: Self-pay | Admitting: Family Medicine

## 2023-01-26 ENCOUNTER — Other Ambulatory Visit: Payer: Self-pay

## 2023-01-26 ENCOUNTER — Other Ambulatory Visit (HOSPITAL_COMMUNITY)
Admission: RE | Admit: 2023-01-26 | Discharge: 2023-01-26 | Disposition: A | Discharge status: Home / Self Care | Payer: Self-pay | Source: Ambulatory Visit | Attending: Family Medicine | Admitting: Family Medicine

## 2023-01-26 VITALS — BP 108/75 | HR 89 | Wt 150.0 lb

## 2023-01-26 DIAGNOSIS — Z113 Encounter for screening for infections with a predominantly sexual mode of transmission: Secondary | ICD-10-CM

## 2023-01-26 DIAGNOSIS — Z1159 Encounter for screening for other viral diseases: Secondary | ICD-10-CM

## 2023-01-26 DIAGNOSIS — J020 Streptococcal pharyngitis: Secondary | ICD-10-CM

## 2023-01-26 DIAGNOSIS — B951 Streptococcus, group B, as the cause of diseases classified elsewhere: Secondary | ICD-10-CM

## 2023-01-26 MED ORDER — AMOXICILLIN 500 MG PO CAPS: 500.0000 mg | ORAL_CAPSULE | Freq: Two times a day (BID) | ORAL | 0 refills | Status: DC

## 2023-01-26 MED ORDER — AMOXICILLIN 500 MG PO CAPS: 500.0000 mg | ORAL_CAPSULE | Freq: Two times a day (BID) | ORAL | 0 refills | Status: AC

## 2023-01-26 NOTE — Telephone Encounter (Signed)
Throat strep culture positive, requires treatment with amoxicillin. Attempted to reach patient, left voicemail.  Sent amox BID x 10 days to pharmacy on file

## 2023-01-26 NOTE — Progress Notes (Signed)
New Patient Visit  HPI:  Patient presents today for a new patient appointment to establish general primary care.  Prior PCP: Not seen since pediatrician  Other care team members: None  Concerns today:  - STI testing. Patient's previous partner mentioned that she was positive for unknown STI. Patient would like to be tested  - Patient mentions that he still has a sore throat. Mentions that he went to the ED on 4/30 and was given lidocaine swish solution. Says he feels some congestion in his throat. Denies nasal congestion or itchy eyes. Denies fevers, cough, or difficulty breathing. Denies history of allergies.   Past Medical Hx:  -None  Past Surgical Hx:  -None  Family Hx: updated in Epic  Social Hx:  - occupation: works at [employer] in [place] [employer]  - lives with: little sister (76 yo) who doesn't smoke   - tobacco: Patient smokes a pack a week wants to quit.  - alcohol: once a week has one or two drinks  - drugs: smokes weed trying to quit   Health Maintenance:  -Due for HPV vaccine   PHYSICAL EXAM: BP 108/75   Pulse 89   Wt 150 lb (68 kg)   SpO2 99%   BMI 22.15 kg/m  General: well appearing, in no acute distress HEENT: soft palate with erythema and edema, no exudates, mildly enlarged tonsils with erythema.  CV: RRR, radial pulses equal and palpable, no BLE edema  Resp: Normal work of breathing on room air, CTAB Abd: Soft, non tender, non distended  Neuro: Alert & Oriented x 4   ASSESSMENT/PLAN:  Health maintenance:  -Patient declines HPV vaccine   Routine screening for STI (sexually transmitted infection) -     Urine cytology ancillary only -     HIV Antibody (routine testing w rflx) -     RPR  GBBS (group B beta hemolytic Streptococcus) pharyngitis Assessment Patient's throat culture was positive for GBS. He was prescribed amoxicillin at that time but did not receive phone call from provider, thus did not pick up the medication. Will prescribe course of  amoxicillin and follow up improvement of symptoms. If not pharyngitis could be due to seasonal allergies given no fever, or other sick symptoms.  -     Amoxicillin; Take 1 capsule (500 mg total) by mouth 2 (two) times daily for 10 days. Must finish full course of medication.  Dispense: 20 capsule; Refill: 0  Encounter for hepatitis C screening test for low risk patient -     HCV Ab w Reflex to Quant PCR    FOLLOW UP: Follow up in 1 year.   Lockie Mola, MD [facility]

## 2023-01-27 ENCOUNTER — Encounter: Payer: Self-pay | Admitting: Family Medicine

## 2023-01-27 LAB — HIV ANTIBODY (ROUTINE TESTING W REFLEX): HIV Screen 4th Generation wRfx: NONREACTIVE

## 2023-01-27 LAB — RPR: RPR Ser Ql: NONREACTIVE

## 2023-01-27 LAB — HCV AB W REFLEX TO QUANT PCR: HCV Ab: NONREACTIVE

## 2023-01-27 LAB — HCV INTERPRETATION

## 2023-01-29 LAB — URINE CYTOLOGY ANCILLARY ONLY
Chlamydia: NEGATIVE
Comment: NEGATIVE
Comment: NORMAL
Neisseria Gonorrhea: NEGATIVE

## 2023-03-16 ENCOUNTER — Ambulatory Visit (INDEPENDENT_AMBULATORY_CARE_PROVIDER_SITE_OTHER): Payer: Self-pay | Admitting: Family Medicine

## 2023-03-16 VITALS — BP 122/81 | HR 64 | Ht 69.0 in | Wt 156.2 lb

## 2023-03-16 DIAGNOSIS — R0789 Other chest pain: Secondary | ICD-10-CM

## 2023-03-16 NOTE — Patient Instructions (Addendum)
It was nice seeing you today!  Take Tylenol every 6 hours. You can also take ibuprofen as needed on top of that.  Try to avoid motions that cause you more pain.  Let me know if you change your mind about getting the X-ray.  Stay well, Littie Deeds, MD [facility] [phone]  --  Make sure to check out at the front desk before you leave today.  Please arrive at least 15 minutes prior to your scheduled appointments.  If you had blood work today, I will send you a MyChart message or a letter if results are normal. Otherwise, I will give you a call.  If you had a referral placed, they will call you to set up an appointment. Please give [place] a call if you don't hear back in the next 2 weeks.  If you need additional refills before your next appointment, please call your pharmacy first.

## 2023-03-16 NOTE — Progress Notes (Signed)
    SUBJECTIVE:   CHIEF COMPLAINT / HPI:    Has noticed anterior chest pain starting about 1 week ago, thinks he pulled a muscle. Started feeling the pain shortly after work. He reports pain is worse with deep breaths and coughing. Reports cough chronically, no recent changes. Denies congestion, rhinorrhea, other sick symptoms. Pain has not improved. Still working as he needs to support 3 children.  Works at [employer]. 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 Smokes THC daily and black and mild.  PERTINENT  PMH / PSH:   Patient Care Team: Lockie Mola, MD as PCP - General (Family Medicine)   OBJECTIVE:   BP 122/81   Pulse 64   Ht 5\' 9"  (1.753 m)   Wt 156 lb 4 oz (70.9 kg)   SpO2 100%   BMI 23.07 kg/m   Physical Exam Constitutional:      General: He is not in acute distress. Cardiovascular:     Rate and Rhythm: Normal rate and regular rhythm.     Heart sounds: Normal heart sounds.  Pulmonary:     Effort: Pulmonary effort is normal. No respiratory distress.     Breath sounds: Normal breath sounds.  Chest:     Chest wall: No tenderness.  Musculoskeletal:     Cervical back: Neck supple.     Comments: Left-sided chest pain reproduced with resisted extension of the shoulder with elbow flexed at 90 degrees.  Neurological:     Mental Status: He is alert.         03/16/2023   10:05 AM  Depression screen PHQ 2/9  Decreased Interest 3  Down, Depressed, Hopeless 1  PHQ - 2 Score 4  Altered sleeping 0  Tired, decreased energy 0  Change in appetite 1  Feeling bad or failure about yourself  0  Trouble concentrating 0  Moving slowly or fidgety/restless 0  Suicidal thoughts 0  PHQ-9 Score 5     {Show previous vital signs (optional):23777}    ASSESSMENT/PLAN:   1. Muscular chest pain Patient presenting with 1 week of chest pain which he noticed after pulling pallets.  Pain was reproduced on pulling motion on exam, suspect that this is  likely MSK in nature, possible strain of the latissimus dorsi.  Low clinical suspicion for other causes such as ACS, PE, pneumonia.  Will treat conservatively with acetaminophen and ibuprofen.  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 - discussed CXR given pleuritic nature, patient declined but he will let me know if he changes his mind - advised to avoid activities that exacerbate pain  Return if symptoms worsen or fail to improve.   Littie Deeds, MD [facility]
